# Patient Record
Sex: Female | Born: 1990 | Race: White | Hispanic: No | Marital: Married | Smoking: Current every day smoker
Health system: Southern US, Community
[De-identification: ages and names within clinical notes are randomized; demographics above are authoritative.]

---

## 2013-11-20 ENCOUNTER — Encounter (HOSPITAL_BASED_OUTPATIENT_CLINIC_OR_DEPARTMENT_OTHER): Payer: Self-pay | Admitting: Emergency Medicine

## 2013-11-20 ENCOUNTER — Emergency Department (HOSPITAL_BASED_OUTPATIENT_CLINIC_OR_DEPARTMENT_OTHER)
Admission: EM | Admit: 2013-11-20 | Discharge: 2013-11-20 | Disposition: A | Discharge status: Home / Self Care | Payer: Self-pay | Attending: Emergency Medicine | Admitting: Emergency Medicine

## 2013-11-20 ENCOUNTER — Emergency Department (HOSPITAL_BASED_OUTPATIENT_CLINIC_OR_DEPARTMENT_OTHER): Payer: Self-pay

## 2013-11-20 DIAGNOSIS — F17200 Nicotine dependence, unspecified, uncomplicated: Secondary | ICD-10-CM | POA: Insufficient documentation

## 2013-11-20 DIAGNOSIS — Z880 Allergy status to penicillin: Secondary | ICD-10-CM | POA: Insufficient documentation

## 2013-11-20 DIAGNOSIS — M436 Torticollis: Secondary | ICD-10-CM | POA: Insufficient documentation

## 2013-11-20 MED ORDER — IBUPROFEN 800 MG PO TABS: 800.0000 mg | ORAL_TABLET | Freq: Three times a day (TID) | ORAL | Status: AC

## 2013-11-20 MED ORDER — KETOROLAC TROMETHAMINE 60 MG/2ML IM SOLN
60.0000 mg | Freq: Once | INTRAMUSCULAR | Status: AC
  Administered 2013-11-20: 60 mg via INTRAMUSCULAR
  Filled 2013-11-20: qty 2

## 2013-11-20 MED ORDER — DIPHENHYDRAMINE HCL 50 MG/ML IJ SOLN
25.0000 mg | Freq: Once | INTRAMUSCULAR | Status: AC
  Administered 2013-11-20: 25 mg via INTRAMUSCULAR
  Filled 2013-11-20: qty 1

## 2013-11-20 MED ORDER — DIAZEPAM 2 MG PO TABS
2.0000 mg | ORAL_TABLET | Freq: Once | ORAL | Status: AC
  Administered 2013-11-20: 2 mg via ORAL
  Filled 2013-11-20: qty 1

## 2013-11-20 MED ORDER — DIAZEPAM 2 MG PO TABS: 2.0000 mg | ORAL_TABLET | Freq: Two times a day (BID) | ORAL | Status: AC

## 2013-11-20 NOTE — ED Notes (Signed)
Denies any injury or trauma

## 2013-11-20 NOTE — Discharge Instructions (Signed)
Torticollis, Acute °You have suddenly (acutely) developed a twisted neck (torticollis). This is usually a self-limited condition. °CAUSES  °Acute torticollis may be caused by malposition, trauma or infection. Most commonly, acute torticollis is caused by sleeping in an awkward position. Torticollis may also be caused by the flexion, extension or twisting of the neck muscles beyond their normal position. Sometimes, the exact cause may not be known. °SYMPTOMS  °Usually, there is pain and limited movement of the neck. Your neck may twist to one side. °DIAGNOSIS  °The diagnosis is often made by physical examination. X-rays, CT scans or MRIs may be done if there is a history of trauma or concern of infection. °TREATMENT  °For a common, stiff neck that develops during sleep, treatment is focused on relaxing the contracted neck muscle. Medications (including shots) may be used to treat the problem. Most cases resolve in several days. Torticollis usually responds to conservative physical therapy. If left untreated, the shortened and spastic neck muscle can cause deformities in the face and neck. Rarely, surgery is required. °HOME CARE INSTRUCTIONS  °· Use over-the-counter and prescription medications as directed by your caregiver. °· Do stretching exercises and massage the neck as directed by your caregiver. °· Follow up with physical therapy if needed and as directed by your caregiver. °SEEK IMMEDIATE MEDICAL CARE IF:  °· You develop difficulty breathing or noisy breathing (stridor). °· You drool, develop trouble swallowing or have pain with swallowing. °· You develop numbness or weakness in the hands or feet. °· You have changes in speech or vision. °· You have problems with urination or bowel movements. °· You have difficulty walking. °· You have a fever. °· You have increased pain. °MAKE SURE YOU:  °· Understand these instructions. °· Will watch your condition. °· Will get help right away if you are not doing well or  get worse. °Document Released: 06/11/2000 Document Revised: 09/06/2011 Document Reviewed: 07/23/2009 °[employer]® Patient Information ©2014 [employer]. ° °

## 2013-11-20 NOTE — ED Notes (Signed)
Pt amb to room 2 with slow steady gait in nad. Pt reports neck pain x last night, worsening this am. Pt is grunting and screaming. With full rom to neck. States "It hurts so bad! It's killing me!"

## 2013-11-20 NOTE — ED Provider Notes (Signed)
CSN: [id number]     Arrival date & time 11/20/13  1008 History  This chart was scribed for Glynn Octave, MD by Leone Payor, ED Scribe. This patient was seen in room MH02/MH02 and the patient's care was started 11:29 AM.    Chief Complaint  Patient presents with  . Neck Pain      The history is provided by the patient. No language interpreter was used.    HPI Comments: Caiya Million is a 23 y.o. female who presents to the Emergency Department complaining of constant, unchanged right sided neck pain that began last night. Patient denies any recent falls or injuries. She denies history of similar symptoms. She denies any upper extremity pain or numbness. She took flexeril yesterday but denies taking any other medications. She denies any aggravating or alleviating symptoms. She denies cough, sore throat, fever, HA, visual disturbances. She reports having a miscarriage 4 days ago. She states she was 2 months pregnant at that time. She was seen at [facility] after this happened but did not have an ultrasound. She denies current vaginal bleeding or abdominal pain.   History reviewed. No pertinent past medical history. History reviewed. No pertinent past surgical history. History reviewed. No pertinent family history. History  Substance Use Topics  . Smoking status: Current Every Day Smoker  . Smokeless tobacco: Not on file  . Alcohol Use: Not on file   OB History   Grav Para Term Preterm Abortions TAB SAB Ect Mult Living                 Review of Systems  A complete 10 system review of systems was obtained and all systems are negative except as noted in the HPI and PMH.    Allergies  Amoxicillin and Penicillins  Home Medications   Prior to Admission medications   Not on File   BP 126/79  Pulse 89  Temp(Src) 98.2 F (36.8 C) (Oral)  Resp 18  Ht 5\' 3"  (1.6 m)  Wt 120 lb (54.432 kg)  BMI 21.26 kg/m2  SpO2 100%  LMP 11/06/2013 Physical Exam  Nursing note and  vitals reviewed. Constitutional: She is oriented to person, place, and time. She appears well-developed and well-nourished.  Tearful and uncomfortable    HENT:  Head: Normocephalic and atraumatic.  Eyes: Conjunctivae and EOM are normal.  Neck: Muscular tenderness present. No spinous process tenderness present.  Torticollis of head turning to right. No midline C-spine tenderness.  No meningismus.  Tenderness and spasms to right paraspinal muscles and trapezius.   Cardiovascular: Normal rate, regular rhythm and normal heart sounds.   Pulmonary/Chest: Effort normal and breath sounds normal. No respiratory distress. She has no wheezes. She has no rales.  Abdominal: She exhibits no distension.  Neurological: She is alert and oriented to person, place, and time. No cranial nerve deficit.  Equal grip strength bilaterally. CN 2-12 intact. No asymmetry   Skin: Skin is warm and dry.  Psychiatric: She has a normal mood and affect.    ED Course  Procedures (including critical care time)  DIAGNOSTIC STUDIES: Oxygen Saturation is 99% on RA, normal by my interpretation.    COORDINATION OF CARE: 11:59 AM Discussed treatment plan with pt at bedside and pt agreed to plan.   Labs Review Labs Reviewed - No data to display  Imaging Review Dg Cervical Spine Complete  11/20/2013   CLINICAL DATA:  Right-sided neck pain.  Severe muscle spasms.  EXAM: CERVICAL SPINE  4+ VIEWS  COMPARISON:  None.  FINDINGS: The prevertebral soft tissues are within normal limits. Vertebral body heights and alignment are maintained. No acute fracture or traumatic subluxation is evident. No significant uncovertebral disease or osseous stenosis is evident. The lung apices are clear.  IMPRESSION: Negative cervical spine radiographs.   Electronically Signed   By: Gennette Pachris  Mattern M.D.   On: 11/20/2013 12:19     EKG Interpretation None      MDM   Final diagnoses:  Torticollis    2 dayHistory of right-sided neck pain with  spasm. No trauma. No fever. No focal weakness, numbness or tingling.  Exam consistent with torticollis. Patient treated with muscle relaxers and anti-inflammatories.  Xray negative. Symptoms much improved after Valium and Toradol. Equal grip strength bilaterally. Will continue outpatient therapy for torticollis with muscle relaxers and antiinflammatories. Return precautions discussed.  BP 126/79  Pulse 89  Temp(Src) 98.2 F (36.8 C) (Oral)  Resp 18  Ht 5\' 3"  (1.6 m)  Wt 120 lb (54.432 kg)  BMI 21.26 kg/m2  SpO2 100%  LMP 11/06/2013    I personally performed the services described in this documentation, which was scribed in my presence. The recorded information has been reviewed and is accurate.   Glynn OctaveStephen Dartanyon Frankowski, MD 11/20/13 [phone]

## 2013-11-20 NOTE — ED Notes (Signed)
Pt states she took one of her husband's muscle relaxers earlier with no relief.

## 2014-10-03 IMAGING — CR DG CERVICAL SPINE COMPLETE 4+V
7 series · 7 of 7 positions shown · non-contrast
Comparison: None.

CLINICAL DATA: Right-sided neck pain.  Severe muscle spasms.

EXAM:
CERVICAL SPINE  4+ VIEWS

[w c-spine lat]
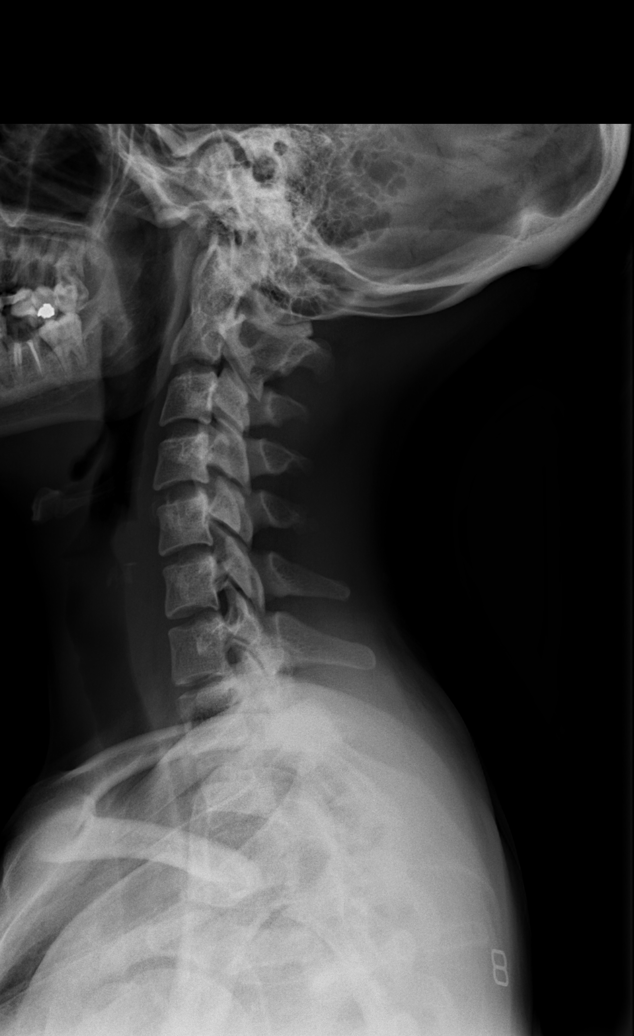

[w c-spine oblique (1 of 2)]
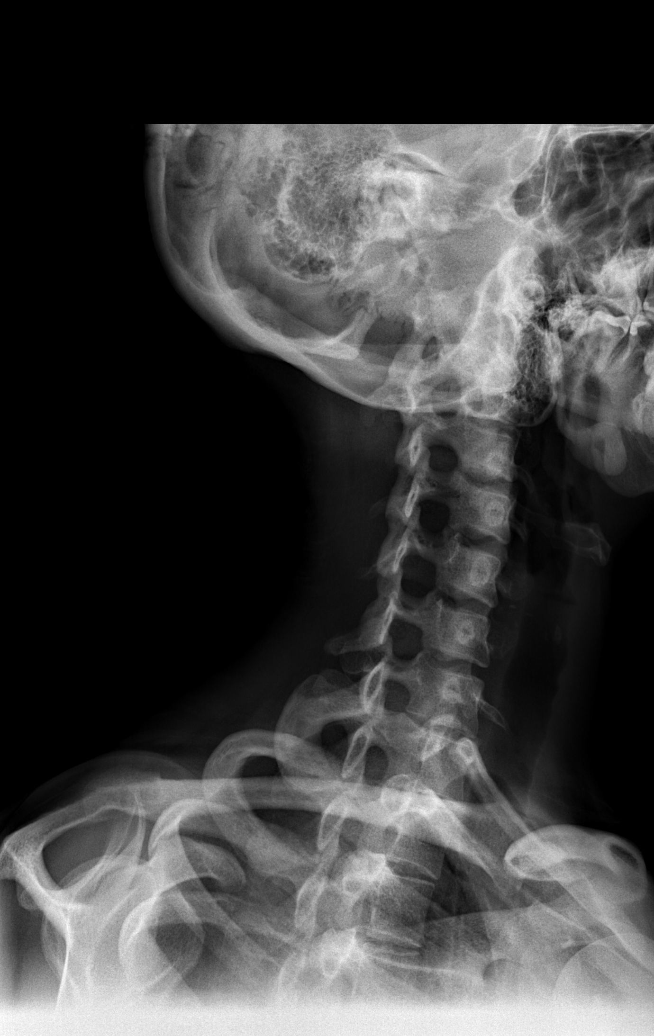

[w c-spine oblique (2 of 2)]
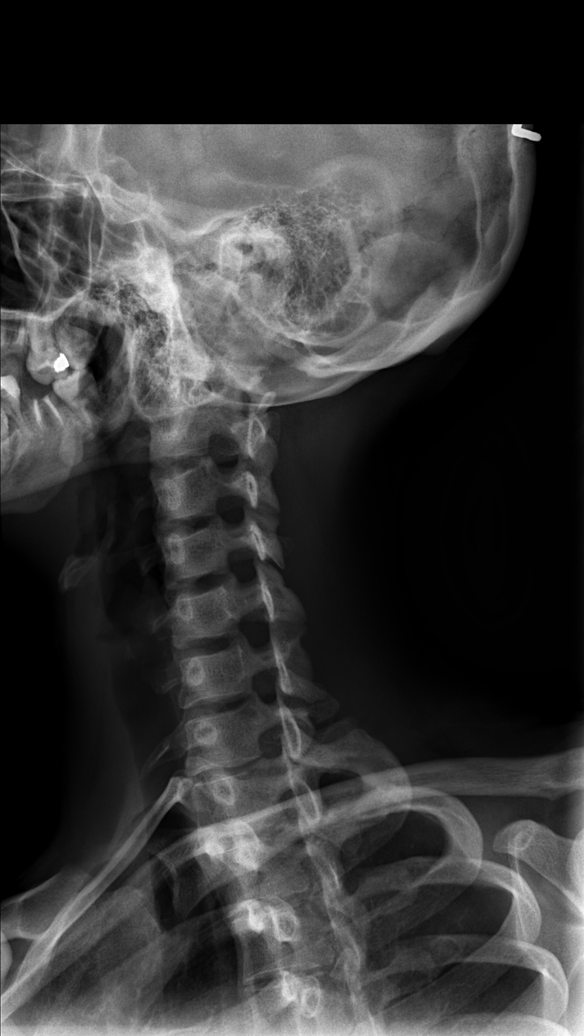

[w c-spine a.p.]
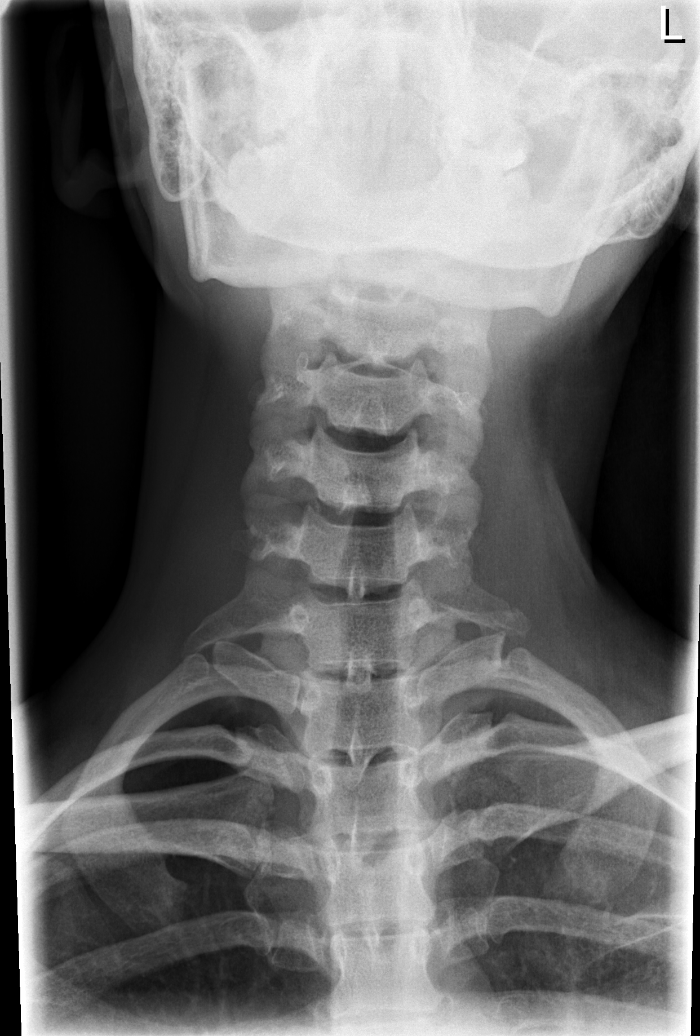

[w c-spine odontoid (1 of 3)]
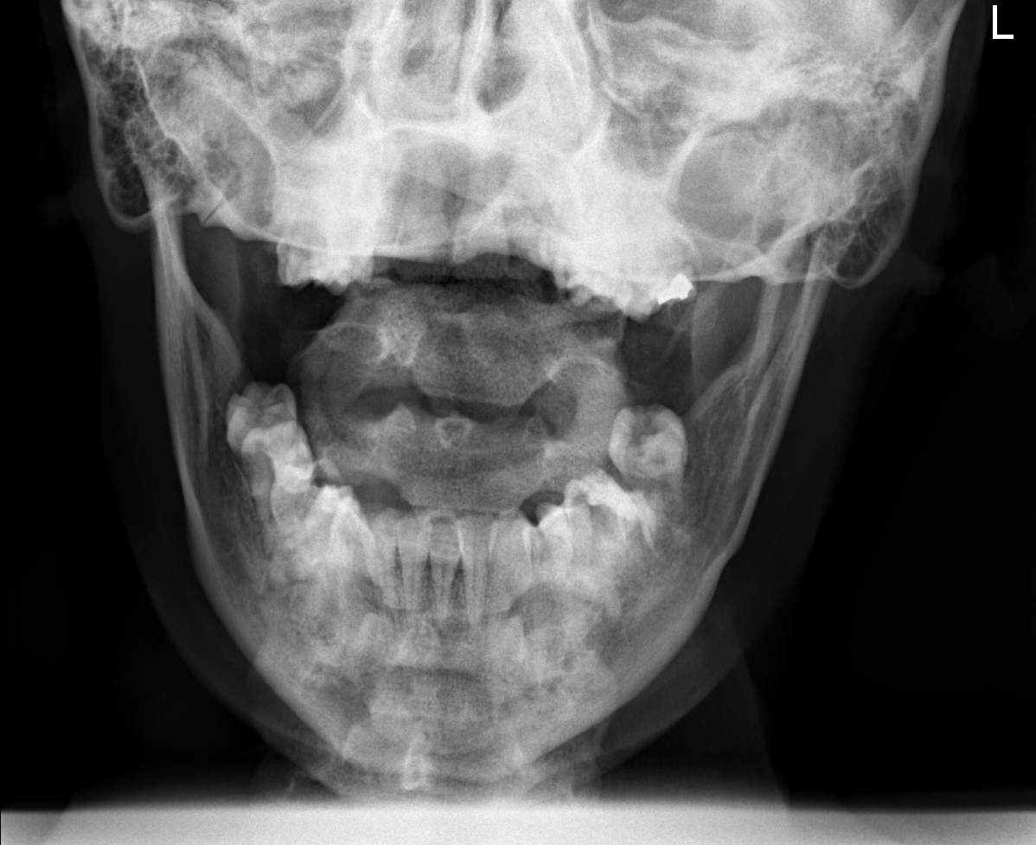

[w c-spine odontoid (2 of 3)]
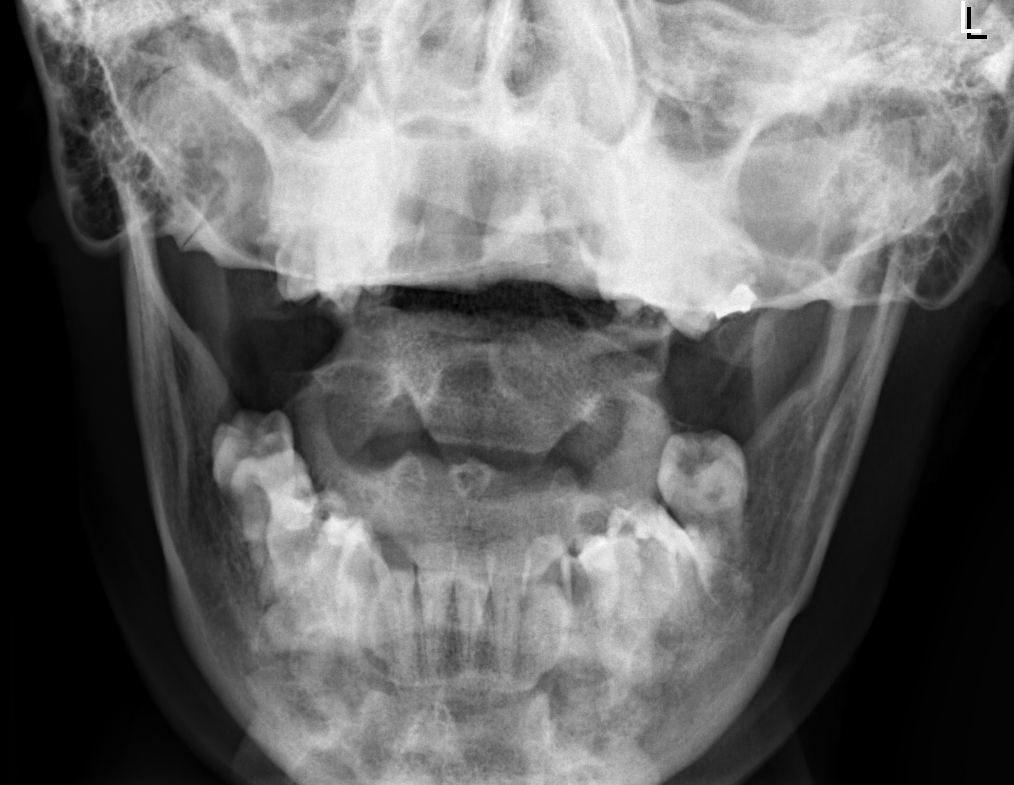

[w c-spine odontoid (3 of 3)]
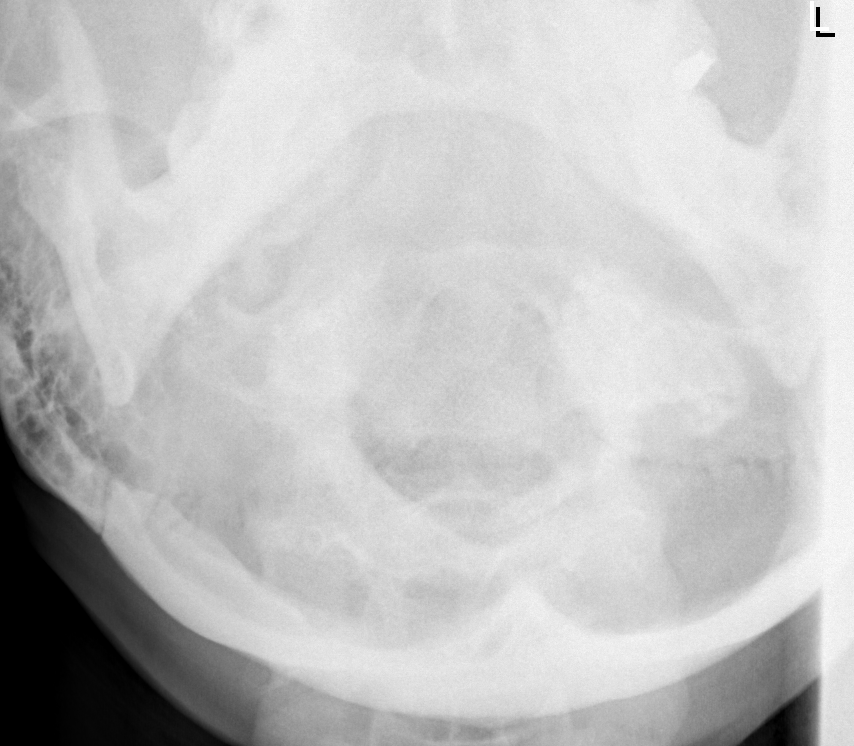

[7 of 7 positions shown; findings below may reference images not displayed]

FINDINGS: The prevertebral soft tissues are within normal limits. Vertebral
body heights and alignment are maintained. No acute fracture or
traumatic subluxation is evident. No significant uncovertebral
disease or osseous stenosis is evident. The lung apices are clear.
IMPRESSION: Negative cervical spine radiographs.
